# Patient Record
Sex: Female | Born: 1950 | Race: White | Hispanic: No | Marital: Single | Smoking: Never smoker
Health system: Southern US, Community
[De-identification: ages and names within clinical notes are randomized; demographics above are authoritative.]

## PROBLEM LIST (undated history)

## (undated) HISTORY — PX: BREAST BIOPSY: SHX20

---

## 2001-01-14 ENCOUNTER — Other Ambulatory Visit: Admission: RE | Admit: 2001-01-14 | Discharge: 2001-01-14 | Payer: Self-pay | Admitting: *Deleted

## 2001-02-26 ENCOUNTER — Other Ambulatory Visit: Admission: RE | Admit: 2001-02-26 | Discharge: 2001-02-26 | Payer: Self-pay | Admitting: *Deleted

## 2001-08-04 ENCOUNTER — Encounter (INDEPENDENT_AMBULATORY_CARE_PROVIDER_SITE_OTHER): Payer: Self-pay | Admitting: Specialist

## 2001-08-04 ENCOUNTER — Observation Stay (HOSPITAL_COMMUNITY): Admission: RE | Admit: 2001-08-04 | Discharge: 2001-08-05 | Payer: Self-pay | Admitting: *Deleted

## 2002-02-10 ENCOUNTER — Other Ambulatory Visit: Admission: RE | Admit: 2002-02-10 | Discharge: 2002-02-10 | Payer: Self-pay | Admitting: *Deleted

## 2003-05-01 ENCOUNTER — Encounter: Payer: Self-pay | Admitting: Emergency Medicine

## 2003-05-01 ENCOUNTER — Inpatient Hospital Stay (HOSPITAL_COMMUNITY): Admission: EM | Admit: 2003-05-01 | Discharge: 2003-05-02 | Payer: Self-pay | Admitting: Emergency Medicine

## 2003-05-01 ENCOUNTER — Encounter: Payer: Self-pay | Admitting: Specialist

## 2004-09-18 ENCOUNTER — Other Ambulatory Visit: Admission: RE | Admit: 2004-09-18 | Discharge: 2004-09-18 | Payer: Self-pay | Admitting: *Deleted

## 2005-11-10 ENCOUNTER — Other Ambulatory Visit: Admission: RE | Admit: 2005-11-10 | Discharge: 2005-11-10 | Payer: Self-pay | Admitting: *Deleted

## 2006-12-01 ENCOUNTER — Other Ambulatory Visit: Admission: RE | Admit: 2006-12-01 | Discharge: 2006-12-01 | Payer: Self-pay | Admitting: *Deleted

## 2008-01-10 ENCOUNTER — Other Ambulatory Visit: Admission: RE | Admit: 2008-01-10 | Discharge: 2008-01-10 | Payer: Self-pay | Admitting: *Deleted

## 2008-05-16 ENCOUNTER — Other Ambulatory Visit: Admission: RE | Admit: 2008-05-16 | Discharge: 2008-05-16 | Payer: Self-pay | Admitting: Gynecology

## 2008-09-19 ENCOUNTER — Other Ambulatory Visit: Admission: RE | Admit: 2008-09-19 | Discharge: 2008-09-19 | Payer: Self-pay | Admitting: Gynecology

## 2009-10-26 ENCOUNTER — Encounter: Admission: RE | Admit: 2009-10-26 | Discharge: 2009-10-26 | Payer: Self-pay | Admitting: Gynecology

## 2009-10-26 ENCOUNTER — Encounter (INDEPENDENT_AMBULATORY_CARE_PROVIDER_SITE_OTHER): Payer: Self-pay | Admitting: Gynecology

## 2010-05-14 ENCOUNTER — Encounter: Admission: RE | Admit: 2010-05-14 | Discharge: 2010-05-14 | Payer: Self-pay | Admitting: Gynecology

## 2010-11-25 ENCOUNTER — Encounter
Admission: RE | Admit: 2010-11-25 | Discharge: 2010-11-25 | Payer: Self-pay | Source: Home / Self Care | Attending: Family Medicine | Admitting: Family Medicine

## 2011-05-02 NOTE — Discharge Summary (Signed)
[facility]  Patient:    Rita Daniel, Rita Daniel Visit Number: [id number] MRN: [id number]          Service Type: SUR Location: 4W 0456 01 Attending Physician:  Collene Schlichter Dictated by:   Almedia Balls Randell Patient, M.D. Adm. Date:  08/04/2001 Disc. Date: 08/05/2001                             Discharge Summary  HISTORY OF PRESENT ILLNESS:  The patient is a 60 year old with abnormal uterine bleeding, enlarging uterus, pelvic pain, history of endometriosis for hysterectomy and bilateral salpingo-oophorectomy.  The remainder of her history and physical are as previously dictated.  LABORATORY DATA:  Preoperative hemoglobin 13.3.  Electrocardiogram was within normal limits.  HOSPITAL COURSE:  The patient was taken to the operating room on August 21, at which time abdominal supracervical hysterectomy and bilateral salpingo-oophorectomy were performed.  The patient did well postoperatively. Diet and ambulation were progressed over the evening of August 21 and early morning of August 22.  On the morning of August 22, she was afebrile and experiencing no problems.  It was felt that she could be discharged at that time.  FINAL DIAGNOSES: 1. Uterine enlargement. 2. Fibroids. 3. Endometriosis. 4. Abnormal uterine bleeding. 5. Pelvic pain.  OPERATION:  Abdominal supracervical hysterectomy and bilateral salpingo-oophorectomy.  Pathology report unavailable at the time of dictation.  DISPOSITION:  Discharged home to return to the office in two weeks for followup.  ACTIVITY:  She was instructed to gradually progress her activities over several weeks at home, and to limit lifting and driving for two weeks.  She was fully ambulatory, on a regular diet, and in good condition at the time of discharge.  DISCHARGE MEDICATIONS: 1. Mepergan Fortis. 2. Doxycycline 100 mg b.i.d. x 6 days.  The patient is to call for any problems. Dictated by:   Almedia Balls Randell Patient,  M.D. Attending Physician:  Collene Schlichter DD:  08/05/01 TD:  08/06/01 Job: [id number]

## 2011-05-02 NOTE — Op Note (Signed)
[name], [name]                            ACCOUNT NO.:  [id number]   MEDICAL RECORD NO.:  [id number]                   PATIENT TYPE:  INP   LOCATION:  1825                                 FACILITY:  [facility]   PHYSICIAN:  Jene Every, M.D.                 DATE OF BIRTH:  1951/11/03   DATE OF PROCEDURE:  05/01/2003  DATE OF DISCHARGE:                                 OPERATIVE REPORT   PREOPERATIVE DIAGNOSIS:  Bimalleolar fracture, ankle fracture dislocation.   POSTOPERATIVE DIAGNOSIS:  Bimalleolar fracture, ankle fracture dislocation.   PROCEDURE:  ORIF right ankle.  Retrieval of intra-articular loose bodies.   ANESTHESIA:  General.   SURGEON:  Jene Every, M.D.   ASSISTANT:  Roma Schanz, P.A.   INDICATIONS FOR PROCEDURE:  This 60 year old sustained a bimalleolar ankle  fracture. Operative intervention was indicated for open reduction and  internal fixation and stabilization.  She was provisionally reduced in the  emergency room and splinted.  Risks and benefits were discussed including  bleeding, infection, __________, wound dehiscence, etc.  There was a lesion  over the medial aspect of the medial malleolus, abrasion over the head of  the fracture.   DESCRIPTION OF PROCEDURE:  The patient was placed in the supine position.  After induction of general anesthesia, the right lower extremity was prepped  and draped and exsanguinated in the usual sterile fashion.  The thigh  tourniquet was inflated to 350 mmHg.  Incision was made over the fibula.  Subcutaneous tissue was dissected.  Bipolar cautery utilized to achieve  hemostasis.  Curvilinear incision was made over the medial malleolus.  Subcutaneous tissue was dissected.  Bipolar cautery was utilized to achieve  hemostasis.  Soft tissue was bulging here noted.  We were posterior to the  area of the abrasion and splinting.  Fracture of the medial malleolus was  noted. This was opened retrieving intra-articular loose  body.  Irrigated the  joint.  Following this, we moved to the fibular side, exposing the fibular  fracture, protecting the sural nerve, superficial peroneal nerve. Complete  fracture was noted. This was reduced, held with a tenaculum, anatomic  reduction was obtained.  We placed interfragmentary screw after the  appropriate drilling and over-drilling.  Compression screw applied. We then  placed a fibula plate, six-hole, fully threaded, and contoured.  Fixed it  proximally with three fully threaded cortical screws and distally with two  fully threaded cancellous screws of the appropriate depth, gauge, and  measurements.  Insertion of the screws with excellent purchase.  The  syndesmosis was found to be intact.   Next, we removed the periosteum from the medial malleolar fracture,  skeletonized the fracture, reduced it.  Fracture hematoma expressed and held  with a reduction clamped.  Placed two 4.0 partially threaded cancellous  screws after provisional fixation with K-wires.  44 mm were utilized and  excellent purchase  obtained. Tenaculum removed.  Stable fracture. We  radiographed it. There was stable mortis, stable syndesmosis.  The wound was  copiously irrigated.  Closed the periosteum with 2-0 Vicryl simple sutures.  Subcutaneous tissue reapproximated bilaterally with 2-0 Vicryl simple  sutures. The skin was reapproximated with staples.  Adaptic was placed. The  tourniquet was deflated.  There was adequate revascularization of the lower  extremity was appreciated.  A short leg cast, well-  padded was applied.  Tourniquet time was 1 hour and 20 minutes.  AP and  lateral plane films showed excellent reduction of the fracture.   The patient tolerated the procedure well with no complications.                                               Jene Every, M.D.    [name]  D:  05/01/2003  T:  05/02/2003  Job:  [id number]

## 2011-05-02 NOTE — H&P (Signed)
[facility]  Patient:    Rita Daniel, Rita Daniel Visit Number: [id number] MRN: [id number]          Service Type: Attending:  Almedia Balls. Randell Patient, M.D. Adm. Date:  08/04/01                           History and Physical  CHIEF COMPLAINT:  Fibroids, postmenopausal bleeding.  HISTORY OF PRESENT ILLNESS:  The patient is a 60 year old, gravida 0, para 0 whose last menstrual period was several years ago.  She had been followed in our office over several years.  At her routine exam on January 14, 2001, she had related that she had heavy flow and some pain on December 24, 2000.  Saline sonogram was performed showing an irregular endometrial stripe and a left ovarian cyst which was somewhat tender and appeared simple.  CA-125 was obtained which was normal.  Repeat ultrasound in mid March showed the cyst to persist and that the endometrium remained irregular.  She underwent laparoscopy and hysteroscopy D&C on February 26, 2001, with findings of pelvic adhesions, endometriosis, left adnexal/ovarian cyst, and myomata of uteri approximately [redacted] weeks gestational size.  Pathology report on the tissue of the D&C showed a benign endometrial polyp and disordered proliferative endometrium.  She has continued to have problems with bleeding since that time.  An examination in late July 2002 showed uterus approximately 12-[redacted] weeks gestational size and tender.  It was felt that in view of the increasing myomata and history of endometriosis and the continued postmenopausal bleeding, that abdominal hysterectomy with bilateral salpingo-oophorectomy was indicated.  This procedure has been discussed fully with the patient.  This discussion included the procedure in full, the risks involved to include risks of anesthesia, injury to bowel, bladder, blood vessel, ureters, postoperative hemorrhage, infection, and recuperation.  The patient has declined hormone replacement in the past and presumably  would continue to decline this.  Pap smear was normal in January 2002.  PAST MEDICAL HISTORY:  Essentially noncontributory in that the patient has no serious illnesses and has only undergone the above-noted surgery.  ALLERGIES:  No known drug allergies.  MEDICATIONS:  None regularly.  FAMILY HISTORY:  Noncontributory, except for two sisters and mother with myomata as well.  REVIEW OF SYSTEMS:  HEENT:  Wears glasses.  CARDIORESPIRATORY:  Negative. GASTROINTESTINAL:  Negative.  GENITOURINARY:  As in present illness. NEUROMUSCULAR:  Negative.  PHYSICAL EXAMINATION:  VITAL SIGNS:  Height 5 feet 2-1/4 inches, weight 165 pounds, blood pressure 152/88, pulse 72, respirations 18.  GENERAL:  Well-developed white female in no acute distress.  HEENT:  Within normal limits.  NECK:  Supple without masses, adenopathy, or bruits.  HEART:  Regular rate and rhythm without murmurs.  LUNGS:  Clear to auscultation and percussion.  BREASTS:  Sitting and lying without mass.  Axilla negative.  ABDOMEN:  Flat and soft with irregularity in the lower abdomen to approximately 3-4 fingerbreadths above synthesis pubis.  PELVIC:  External genitalia, Bartholins, urethral, and Skenes glands are atrophic.  Vagina is atrophic as well and admits only one digit.  It is not possible to visualize the cervix secondary to the atrophic vaginal mucosa and the patients guarding.  Uterus is felt to be midposition and approximately 12-[redacted] weeks gestational size on rectal exam.  There are no palpable adnexal mass.  Rectal exam is otherwise negative.  EXTREMITIES:  Within normal limits.  Central nervous system grossly intact. Skin without suspicious lesions.  IMPRESSION:  Leiomyomata uteri enlarging, continued postmenopausal bleeding, endometriosis, and pelvic pain.  DISPOSITION:  As noted above. Attending:  Almedia Balls. Randell Patient, M.D. DD:  08/02/01 TD:  08/03/01 Job: [id number]

## 2011-05-02 NOTE — Op Note (Signed)
[facility]  Patient:    Rita Daniel, Rita Daniel Visit Number: [id number] MRN: [id number]          Service Type: SUR Location: 4W 0456 01 Attending Physician:  Collene Schlichter Proc. Date: 08/04/01 Adm. Date:  08/04/2001   CC:         Harl Bowie, M.D.   Operative Report  PREOPERATIVE DIAGNOSES:  Uterine enlargement with pain, abnormal bleeding, fibroids.  POSTOPERATIVE DIAGNOSES:  Uterine enlargement with pain, abnormal bleeding, fibroids.  Pending pathology.  OPERATION PERFORMED:  Abdominal supracervical hysterectomy, bilateral salpingo-oophorectomy.  ANESTHESIA:  General with oral tracheal.  SURGEON:  Almedia Balls. Randell Patient, M.D.  FIRST ASSISTANT:  Harl Bowie, M.D.  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 She has been fully counselled as to the nature of the procedure and the risks involved to include risk of anesthesia, injury to bowel, bladder, blood vessels, ureters, postoperative hemorrhage, infection, recuperation, and possible hormone replacement. She fully understands all these considerations and wishes to proceed on August 04, 2001.  OPERATIVE FINDINGS:  On entry into the abdomen, the uterus was noted to be approximately [redacted] weeks gestational size with very prominent leiomyomata on the anterior fundal surface just under the urinary bladder. Both tubes and ovaries appeared normal except that there were some adhesions along the area of the left adnexal structures. Palpation of the upper abdominal viscera to include lower liver edge, gallbladder, spleen, area of the appendix, kidneys and periaortic areas were normal to palpation and/or visualization.  DESCRIPTION OF PROCEDURE:  With the patient under general anesthesia, prepared and draped in the usual sterile fashion, with a Foley catheter in her bladder, a lower abdominal transverse incision was made  and carried into the peritoneal cavity without difficulty. A self retaining retractor was placed, and the bowel was packed off. Kelly clamps were used to clamp the uterine ovarian anastomoses, tubes and round ligaments bilaterally for traction and hemostasis. The round ligaments were then transected using Bovie electrocoagulation with development of a bladder flap anteriorly and development of the retroperitoneal space with identification of the infundibulopelvic ligaments well above the ureters. The IP ligaments were then clamped, cut and doubly ligated with #1 chromic catgut. The uterine vessels bilaterally were then skeletonized, clamped, cut and suture ligated with #1 chromic catgut. The cardinal ligaments bilaterally were likewise clamped, cut and suture ligated with #1 chromic catgut. It was then possible to excise the uterine fundus, tubes and ovaries as a single specimen by using Bovie electrocoagulation to transect the cervix. The endocervical canal was treated with Bovie electrocoagulation for destruction of endocervical glands and prevention of leukorrhea. The cervix was reapproximated and rendered hemostatic with interrupted figure-of-eight sutures of #1 chromic catgut. The area was lavaged with copious amounts of lactated Ringers solution, and after noting that hemostasis was maintained and that sponge and instrument counts were correct, the peritoneum was closed with a continuous suture of #0 Vicryl. The fascia was closed with two sutures of #0 Vicryl which were brought from the lateral aspects of the incision and tied separately in the midline. The subcutaneous fat was reapproximated with interrupted horizontal mattress sutures of #1 chromic catgut. The skin was closed with a subcuticular suture of 3-0 plain catgut. Estimated blood loss was less than 100 ml. The patient was taken to the recovery room in good condition with clear urine and a Foley catheter tubing. She will be  placed  on 23 hour observation following surgery. ttending Physician:  Collene Schlichter DD:  08/04/01 TD:  08/04/01 Job: [phone]

## 2011-11-20 ENCOUNTER — Other Ambulatory Visit: Payer: Self-pay | Admitting: Family Medicine

## 2011-11-20 DIAGNOSIS — N63 Unspecified lump in unspecified breast: Secondary | ICD-10-CM

## 2011-12-03 ENCOUNTER — Ambulatory Visit
Admission: RE | Admit: 2011-12-03 | Discharge: 2011-12-03 | Disposition: A | Discharge status: Home / Self Care | Payer: BC Managed Care – PPO | Source: Ambulatory Visit | Attending: Family Medicine | Admitting: Family Medicine

## 2011-12-03 DIAGNOSIS — N63 Unspecified lump in unspecified breast: Secondary | ICD-10-CM

## 2012-11-15 ENCOUNTER — Other Ambulatory Visit: Payer: Self-pay | Admitting: Gynecology

## 2012-11-15 DIAGNOSIS — Z1231 Encounter for screening mammogram for malignant neoplasm of breast: Secondary | ICD-10-CM

## 2012-12-23 ENCOUNTER — Ambulatory Visit
Admission: RE | Admit: 2012-12-23 | Discharge: 2012-12-23 | Disposition: A | Discharge status: Home / Self Care | Payer: BC Managed Care – PPO | Source: Ambulatory Visit | Attending: Gynecology | Admitting: Gynecology

## 2012-12-23 DIAGNOSIS — Z1231 Encounter for screening mammogram for malignant neoplasm of breast: Secondary | ICD-10-CM

## 2013-12-13 ENCOUNTER — Other Ambulatory Visit: Payer: Self-pay

## 2013-12-13 DIAGNOSIS — Z1231 Encounter for screening mammogram for malignant neoplasm of breast: Secondary | ICD-10-CM

## 2013-12-26 ENCOUNTER — Ambulatory Visit
Admission: RE | Admit: 2013-12-26 | Discharge: 2013-12-26 | Disposition: A | Discharge status: Home / Self Care | Payer: BC Managed Care – PPO | Source: Ambulatory Visit

## 2013-12-26 DIAGNOSIS — Z1231 Encounter for screening mammogram for malignant neoplasm of breast: Secondary | ICD-10-CM

## 2014-10-23 ENCOUNTER — Other Ambulatory Visit: Payer: Self-pay

## 2014-10-23 DIAGNOSIS — Z1231 Encounter for screening mammogram for malignant neoplasm of breast: Secondary | ICD-10-CM

## 2014-12-26 ENCOUNTER — Other Ambulatory Visit: Payer: Self-pay | Admitting: Gynecology

## 2014-12-27 LAB — CYTOLOGY - PAP

## 2014-12-28 ENCOUNTER — Ambulatory Visit
Admission: RE | Admit: 2014-12-28 | Discharge: 2014-12-28 | Disposition: A | Discharge status: Home / Self Care | Payer: Medicare HMO | Source: Ambulatory Visit

## 2014-12-28 DIAGNOSIS — Z1231 Encounter for screening mammogram for malignant neoplasm of breast: Secondary | ICD-10-CM

## 2015-07-05 ENCOUNTER — Other Ambulatory Visit: Payer: Self-pay | Admitting: Gynecology

## 2015-07-09 LAB — CYTOLOGY - PAP

## 2015-12-06 ENCOUNTER — Other Ambulatory Visit: Payer: Self-pay

## 2015-12-06 DIAGNOSIS — Z1231 Encounter for screening mammogram for malignant neoplasm of breast: Secondary | ICD-10-CM

## 2016-01-11 ENCOUNTER — Ambulatory Visit
Admission: RE | Admit: 2016-01-11 | Discharge: 2016-01-11 | Disposition: A | Discharge status: Home / Self Care | Payer: 59 | Source: Ambulatory Visit

## 2016-01-11 DIAGNOSIS — Z1231 Encounter for screening mammogram for malignant neoplasm of breast: Secondary | ICD-10-CM

## 2016-12-01 ENCOUNTER — Other Ambulatory Visit: Payer: Self-pay | Admitting: Obstetrics and Gynecology

## 2016-12-01 DIAGNOSIS — Z1231 Encounter for screening mammogram for malignant neoplasm of breast: Secondary | ICD-10-CM

## 2017-01-12 ENCOUNTER — Ambulatory Visit
Admission: RE | Admit: 2017-01-12 | Discharge: 2017-01-12 | Disposition: A | Discharge status: Home / Self Care | Payer: 59 | Source: Ambulatory Visit | Attending: Obstetrics and Gynecology | Admitting: Obstetrics and Gynecology

## 2017-01-12 DIAGNOSIS — Z1231 Encounter for screening mammogram for malignant neoplasm of breast: Secondary | ICD-10-CM

## 2017-01-14 ENCOUNTER — Other Ambulatory Visit: Payer: Self-pay | Admitting: Obstetrics and Gynecology

## 2017-01-14 DIAGNOSIS — R928 Other abnormal and inconclusive findings on diagnostic imaging of breast: Secondary | ICD-10-CM

## 2017-01-16 ENCOUNTER — Ambulatory Visit
Admission: RE | Admit: 2017-01-16 | Discharge: 2017-01-16 | Disposition: A | Discharge status: Home / Self Care | Payer: 59 | Source: Ambulatory Visit | Attending: Obstetrics and Gynecology | Admitting: Obstetrics and Gynecology

## 2017-01-16 DIAGNOSIS — R928 Other abnormal and inconclusive findings on diagnostic imaging of breast: Secondary | ICD-10-CM

## 2017-12-11 ENCOUNTER — Other Ambulatory Visit: Payer: Self-pay | Admitting: Obstetrics and Gynecology

## 2017-12-11 DIAGNOSIS — Z139 Encounter for screening, unspecified: Secondary | ICD-10-CM

## 2018-01-12 ENCOUNTER — Ambulatory Visit
Admission: RE | Admit: 2018-01-12 | Discharge: 2018-01-12 | Disposition: A | Discharge status: Home / Self Care | Payer: 59 | Source: Ambulatory Visit | Attending: Obstetrics and Gynecology | Admitting: Obstetrics and Gynecology

## 2018-01-12 DIAGNOSIS — Z139 Encounter for screening, unspecified: Secondary | ICD-10-CM

## 2018-12-29 ENCOUNTER — Other Ambulatory Visit: Payer: Self-pay | Admitting: Obstetrics and Gynecology

## 2018-12-29 DIAGNOSIS — Z1231 Encounter for screening mammogram for malignant neoplasm of breast: Secondary | ICD-10-CM

## 2019-01-25 ENCOUNTER — Ambulatory Visit
Admission: RE | Admit: 2019-01-25 | Discharge: 2019-01-25 | Disposition: A | Discharge status: Home / Self Care | Payer: Medicare Other | Source: Ambulatory Visit | Attending: Obstetrics and Gynecology | Admitting: Obstetrics and Gynecology

## 2019-01-25 DIAGNOSIS — Z1231 Encounter for screening mammogram for malignant neoplasm of breast: Secondary | ICD-10-CM

## 2019-03-27 ENCOUNTER — Encounter (HOSPITAL_BASED_OUTPATIENT_CLINIC_OR_DEPARTMENT_OTHER): Payer: Self-pay | Admitting: Emergency Medicine

## 2019-03-27 ENCOUNTER — Emergency Department (HOSPITAL_BASED_OUTPATIENT_CLINIC_OR_DEPARTMENT_OTHER)
Admission: EM | Admit: 2019-03-27 | Discharge: 2019-03-27 | Disposition: A | Discharge status: Home / Self Care | Payer: Medicare Other | Attending: Emergency Medicine | Admitting: Emergency Medicine

## 2019-03-27 ENCOUNTER — Emergency Department (HOSPITAL_BASED_OUTPATIENT_CLINIC_OR_DEPARTMENT_OTHER): Payer: Medicare Other

## 2019-03-27 ENCOUNTER — Other Ambulatory Visit: Payer: Self-pay

## 2019-03-27 DIAGNOSIS — S39012A Strain of muscle, fascia and tendon of lower back, initial encounter: Secondary | ICD-10-CM | POA: Diagnosis not present

## 2019-03-27 DIAGNOSIS — X500XXA Overexertion from strenuous movement or load, initial encounter: Secondary | ICD-10-CM | POA: Diagnosis not present

## 2019-03-27 DIAGNOSIS — Y92003 Bedroom of unspecified non-institutional (private) residence as the place of occurrence of the external cause: Secondary | ICD-10-CM | POA: Diagnosis not present

## 2019-03-27 DIAGNOSIS — Y999 Unspecified external cause status: Secondary | ICD-10-CM | POA: Insufficient documentation

## 2019-03-27 DIAGNOSIS — S3992XA Unspecified injury of lower back, initial encounter: Secondary | ICD-10-CM | POA: Diagnosis present

## 2019-03-27 DIAGNOSIS — M62830 Muscle spasm of back: Secondary | ICD-10-CM | POA: Insufficient documentation

## 2019-03-27 DIAGNOSIS — T148XXA Other injury of unspecified body region, initial encounter: Secondary | ICD-10-CM

## 2019-03-27 DIAGNOSIS — Y93F2 Activity, caregiving, lifting: Secondary | ICD-10-CM | POA: Diagnosis not present

## 2019-03-27 MED ORDER — CYCLOBENZAPRINE HCL 5 MG PO TABS: 5.0000 mg | ORAL_TABLET | Freq: Two times a day (BID) | ORAL | 0 refills | Status: AC | PRN

## 2019-03-27 NOTE — Discharge Instructions (Signed)
Your history and exam today are consistent with injuring the muscles in her low back.  The x-ray did not show acute fracture dislocation.  It did show the degenerative disc disease in her low back.  Please fill the prescription of muscle relaxant if your symptoms continue to worsen and you need assistance with the pain.  Please rest.  Please stay hydrated.  Please follow-up with your primary doctor.  If any symptoms change or worsen or develop new symptoms like we discussed, please return to nearest emergency department.

## 2019-03-27 NOTE — ED Triage Notes (Signed)
Pt reports tried to help her mom get dressed, tried to catch her mom from sliding off the bed and heard a pop in her low back. Pt denies numbness or tingling down legs.

## 2019-03-27 NOTE — ED Notes (Signed)
Patient transported to X-ray 

## 2019-03-27 NOTE — ED Notes (Signed)
ED Provider at bedside. 

## 2019-03-27 NOTE — ED Provider Notes (Signed)
[facility] EMERGENCY DEPARTMENT Provider Note   CSN: [id number] Arrival date & time: 03/27/19  0931    History   Chief Complaint Chief Complaint  Patient presents with  . Back Pain    HPI Rita Daniel is a 69 y.o. female.     The history is provided by the patient and medical records. No language interpreter was used.  Back Pain  Location:  Lumbar spine Quality:  Aching Radiates to:  Does not radiate Pain severity:  Moderate Pain is:  Unable to specify Onset quality:  Sudden Duration:  2 hours Timing:  Constant Progression:  Improving Chronicity:  New Context: lifting heavy objects   Relieved by:  Being still Worsened by:  Twisting Associated symptoms: no abdominal pain, no bladder incontinence, no bowel incontinence, no chest pain, no dysuria, no fever, no headaches, no leg pain, no numbness, no paresthesias, no pelvic pain, no perianal numbness, no tingling and no weakness   Risk factors: no hx of cancer     History reviewed. No pertinent past medical history.  There are no active problems to display for this patient.   Past Surgical History:  Procedure Laterality Date  . BREAST BIOPSY Right      OB History   No obstetric history on file.      Home Medications    Prior to Admission medications   Not on File    Family History No family history on file.  Social History Social History   Tobacco Use  . Smoking status: Never Smoker  . Smokeless tobacco: Never Used  Substance Use Topics  . Alcohol use: Not Currently  . Drug use: Not Currently     Allergies   Patient has no known allergies.   Review of Systems Review of Systems  Constitutional: Negative for chills, diaphoresis, fatigue and fever.  HENT: Negative for congestion.   Eyes: Negative for visual disturbance.  Respiratory: Negative for cough, chest tightness, shortness of breath and wheezing.   Cardiovascular: Negative for chest pain, palpitations and leg swelling.   Gastrointestinal: Negative for abdominal pain, bowel incontinence, constipation, diarrhea, nausea and vomiting.  Genitourinary: Negative for bladder incontinence, dysuria, flank pain and pelvic pain.  Musculoskeletal: Positive for back pain. Negative for neck pain and neck stiffness.  Neurological: Negative for tingling, weakness, light-headedness, numbness, headaches and paresthesias.  Psychiatric/Behavioral: Negative for agitation and confusion.  All other systems reviewed and are negative.    Physical Exam Updated Vital Signs BP (!) 168/83   Pulse 85   Temp 98 F (36.7 C) (Oral)   Resp 20   Ht 5\' 2"  (1.575 m)   Wt 86.2 kg   SpO2 100%   BMI 34.75 kg/m   Physical Exam Vitals signs and nursing note reviewed.  Constitutional:      General: She is not in acute distress.    Appearance: She is well-developed. She is not ill-appearing, toxic-appearing or diaphoretic.  HENT:     Head: Normocephalic and atraumatic.     Right Ear: External ear normal.     Left Ear: External ear normal.     Nose: Nose normal. No congestion or rhinorrhea.     Mouth/Throat:     Pharynx: No oropharyngeal exudate.  Eyes:     Conjunctiva/sclera: Conjunctivae normal.     Pupils: Pupils are equal, round, and reactive to light.  Neck:     Musculoskeletal: Normal range of motion and neck supple. No muscular tenderness.  Cardiovascular:  Rate and Rhythm: Normal rate.  Pulmonary:     Effort: No respiratory distress.     Breath sounds: No stridor. No wheezing, rhonchi or rales.  Chest:     Chest wall: No tenderness.  Abdominal:     General: There is no distension.     Tenderness: There is no abdominal tenderness. There is no right CVA tenderness, left CVA tenderness or rebound.  Musculoskeletal:        General: No tenderness.     Right lower leg: No edema.     Left lower leg: No edema.  Skin:    General: Skin is warm.     Capillary Refill: Capillary refill takes less than 2 seconds.      Findings: No erythema or rash.  Neurological:     General: No focal deficit present.     Mental Status: She is alert and oriented to person, place, and time.     Sensory: No sensory deficit.     Motor: No weakness or abnormal muscle tone.     Deep Tendon Reflexes: Reflexes are normal and symmetric. Reflexes normal.  Psychiatric:        Mood and Affect: Mood normal.      ED Treatments / Results  Labs (all labs ordered are listed, but only abnormal results are displayed) Labs Reviewed - No data to display  EKG None  Radiology Dg Lumbar Spine Complete  Result Date: 03/27/2019 CLINICAL DATA:  68 year old injured the low back earlier today while trying to keep her mother from falling. Patient states that she felt a popping sensation in the low back. No radiculopathy. Initial encounter. EXAM: LUMBAR SPINE - COMPLETE 4+ VIEW COMPARISON:  None. FINDINGS: Five non-rib-bearing lumbar vertebrae with anatomic alignment. No fractures. Disc spaces well-preserved with perhaps minimal narrowing at L4-5. Spondylosis involving the UPPER endplate of L4. Moderate disc space narrowing and endplate hypertrophic changes at T12-L1. 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 Facet degenerative changes at L3-4, L4-5 and L5-S1. Sacroiliac joints intact without significant degenerative changes. IMPRESSION: 1. No acute osseous abnormality. 2. Minimal degenerative disc disease at L4-5. 3. Moderate degenerative disc disease and spondylosis at T12-L1. 4. Facet degenerative changes at L3-4, L4-5 and L5-S1. Electronically Signed   By: Hulan Saashomas  Lawrence M.D.   On: 03/27/2019 10:44    Procedures Procedures (including critical care time)  Medications Ordered in ED Medications - No data to display   Initial Impression / Assessment and Plan / ED Course  I have reviewed the triage vital signs and the nursing notes.  Pertinent labs & imaging results that were available during my care of  the patient were reviewed by me and considered in my medical decision making (see chart for details).        Rita Daniel is a 68 y.o. female with no significant past medical history who presents with low back pain.  Patient reports that she was helping her mother out of the bed this morning when her mother slipped and the patient try to catch her.  Patient felt a pop in her low back and had immediate onset of moderate to severe low back pain.  She denies radiation of the pain.  She denies any loss of bowel or bladder control.  She denies any numbness, tingling, weakness of extremities.  She reports the pain was treated with Tylenol at home however due to continued pain she presents for evaluation.  She denies any nausea, vomiting, or urinary symptoms.  She denies any preceding symptoms.  She denies any pain in her upper back neck or head.  No other complaints.  On exam, patient had no back tenderness.  Patient has continued pain in her low back.  Straight leg raise was negative on both legs.  Normal sensation strength, and pulses in the legs.  Abdomen and chest nontender.  Upper back nontender.  Suspect soft tissue and muscular pain based on her description of symptoms and exam.    Given lack of concerning associated symptoms, shared decision made conversation was held and we decided to order x-ray instead of CT scan initially.  If x-ray shows abnormalities, will likely go to CT however if x-ray is reassuring, anticipate she will be stable for discharge home with outpatient follow-up.        11:09 AM X-ray shows no fracture or dislocation.  Patient does have some degenerative disc disease which patient was informed of.  Patient reports her pain has improved and is very mild currently.  Patient was felt stable for discharge home given improved symptoms and reassuring imaging.  We discussed possibility of occult injury and patient will follow with PCP for further management.  Patient given  prescription for low-dose muscle relaxant to use if her symptoms persist.  Patient understood plan of care and return precautions.  She otherwise or concerns and was discharged in good condition.   Final Clinical Impressions(s) / ED Diagnoses   Final diagnoses:  Muscle strain  Strain of lumbar region, initial encounter  Muscle spasm of back    ED Discharge Orders         Ordered    cyclobenzaprine (FLEXERIL) 5 MG tablet  2 times daily PRN     03/27/19 1108          Clinical Impression: 1. Muscle strain   2. Strain of lumbar region, initial encounter   3. Muscle spasm of back     Disposition: Discharge  Condition: Good  I have discussed the results, Dx and Tx plan with the pt(& family if present). He/she/they expressed understanding and agree(s) with the plan. Discharge instructions discussed at great length. Strict return precautions discussed and pt &/or family have verbalized understanding of the instructions. No further questions at time of discharge.    New Prescriptions   CYCLOBENZAPRINE (FLEXERIL) 5 MG TABLET    Take 1 tablet (5 mg total) by mouth 2 (two) times daily as needed for muscle spasms.    Follow Up: Tracey Harries, MD [address] [place] [address] [phone]     [facility] [place] EMERGENCY DEPARTMENT [address] [id number] [address] [place] [address] [phone]       Tegeler, Canary Brim, MD 03/27/19 1110

## 2020-01-18 ENCOUNTER — Other Ambulatory Visit: Payer: Self-pay | Admitting: Obstetrics and Gynecology

## 2020-01-18 DIAGNOSIS — Z1231 Encounter for screening mammogram for malignant neoplasm of breast: Secondary | ICD-10-CM

## 2020-02-24 ENCOUNTER — Ambulatory Visit
Admission: RE | Admit: 2020-02-24 | Discharge: 2020-02-24 | Disposition: A | Discharge status: Home / Self Care | Payer: Medicare Other | Source: Ambulatory Visit | Attending: Obstetrics and Gynecology | Admitting: Obstetrics and Gynecology

## 2020-02-24 ENCOUNTER — Other Ambulatory Visit: Payer: Self-pay

## 2020-02-24 DIAGNOSIS — Z1231 Encounter for screening mammogram for malignant neoplasm of breast: Secondary | ICD-10-CM

## 2020-02-27 ENCOUNTER — Other Ambulatory Visit: Payer: Self-pay | Admitting: Obstetrics and Gynecology

## 2020-02-27 DIAGNOSIS — R928 Other abnormal and inconclusive findings on diagnostic imaging of breast: Secondary | ICD-10-CM

## 2020-03-14 ENCOUNTER — Other Ambulatory Visit: Payer: Self-pay

## 2020-03-14 ENCOUNTER — Ambulatory Visit
Admission: RE | Admit: 2020-03-14 | Discharge: 2020-03-14 | Disposition: A | Discharge status: Home / Self Care | Payer: Medicare PPO | Source: Ambulatory Visit | Attending: Obstetrics and Gynecology | Admitting: Obstetrics and Gynecology

## 2020-03-14 DIAGNOSIS — R928 Other abnormal and inconclusive findings on diagnostic imaging of breast: Secondary | ICD-10-CM

## 2021-02-08 ENCOUNTER — Other Ambulatory Visit: Payer: Self-pay | Admitting: Obstetrics and Gynecology

## 2021-02-08 DIAGNOSIS — Z1231 Encounter for screening mammogram for malignant neoplasm of breast: Secondary | ICD-10-CM

## 2021-04-01 ENCOUNTER — Inpatient Hospital Stay: Admission: RE | Admit: 2021-04-01 | Payer: Medicare PPO | Source: Ambulatory Visit

## 2021-04-02 ENCOUNTER — Ambulatory Visit: Payer: Medicare PPO

## 2021-04-16 ENCOUNTER — Ambulatory Visit
Admission: RE | Admit: 2021-04-16 | Discharge: 2021-04-16 | Disposition: A | Discharge status: Home / Self Care | Payer: Medicare PPO | Source: Ambulatory Visit | Attending: Obstetrics and Gynecology | Admitting: Obstetrics and Gynecology

## 2021-04-16 ENCOUNTER — Other Ambulatory Visit: Payer: Self-pay

## 2021-04-16 DIAGNOSIS — Z1231 Encounter for screening mammogram for malignant neoplasm of breast: Secondary | ICD-10-CM

## 2021-04-18 ENCOUNTER — Other Ambulatory Visit: Payer: Self-pay | Admitting: Obstetrics and Gynecology

## 2021-04-18 DIAGNOSIS — R928 Other abnormal and inconclusive findings on diagnostic imaging of breast: Secondary | ICD-10-CM

## 2021-05-08 ENCOUNTER — Ambulatory Visit
Admission: RE | Admit: 2021-05-08 | Discharge: 2021-05-08 | Disposition: A | Discharge status: Home / Self Care | Payer: Medicare PPO | Source: Ambulatory Visit | Attending: Obstetrics and Gynecology | Admitting: Obstetrics and Gynecology

## 2021-05-08 ENCOUNTER — Other Ambulatory Visit: Payer: Self-pay

## 2021-05-08 ENCOUNTER — Ambulatory Visit: Admission: RE | Admit: 2021-05-08 | Payer: Medicare PPO | Source: Ambulatory Visit

## 2021-05-08 DIAGNOSIS — R928 Other abnormal and inconclusive findings on diagnostic imaging of breast: Secondary | ICD-10-CM

## 2021-12-07 ENCOUNTER — Encounter (HOSPITAL_BASED_OUTPATIENT_CLINIC_OR_DEPARTMENT_OTHER): Payer: Self-pay

## 2021-12-07 ENCOUNTER — Emergency Department (HOSPITAL_BASED_OUTPATIENT_CLINIC_OR_DEPARTMENT_OTHER)
Admission: EM | Admit: 2021-12-07 | Discharge: 2021-12-07 | Disposition: A | Discharge status: Home / Self Care | Payer: Medicare PPO | Attending: Student | Admitting: Student

## 2021-12-07 ENCOUNTER — Other Ambulatory Visit: Payer: Self-pay

## 2021-12-07 DIAGNOSIS — Z20822 Contact with and (suspected) exposure to covid-19: Secondary | ICD-10-CM | POA: Insufficient documentation

## 2021-12-07 DIAGNOSIS — R0981 Nasal congestion: Secondary | ICD-10-CM | POA: Insufficient documentation

## 2021-12-07 DIAGNOSIS — J029 Acute pharyngitis, unspecified: Secondary | ICD-10-CM | POA: Diagnosis present

## 2021-12-07 DIAGNOSIS — I10 Essential (primary) hypertension: Secondary | ICD-10-CM | POA: Diagnosis not present

## 2021-12-07 LAB — RESP PANEL BY RT-PCR (FLU A&B, COVID) ARPGX2
Influenza A by PCR: NEGATIVE
Influenza B by PCR: NEGATIVE
SARS Coronavirus 2 by RT PCR: NEGATIVE

## 2021-12-07 LAB — GROUP A STREP BY PCR: Group A Strep by PCR: NOT DETECTED

## 2021-12-07 MED ORDER — DEXAMETHASONE SODIUM PHOSPHATE 10 MG/ML IJ SOLN: 10.0000 mg | Freq: Once | INTRAMUSCULAR | Status: DC

## 2021-12-07 MED ORDER — DEXAMETHASONE SODIUM PHOSPHATE 10 MG/ML IJ SOLN
10.0000 mg | Freq: Once | INTRAMUSCULAR | Status: AC
  Administered 2021-12-07: 22:00:00 10 mg via INTRAMUSCULAR
  Filled 2021-12-07: qty 1

## 2021-12-07 NOTE — ED Triage Notes (Addendum)
Pt states she has had a sore throat, progressively getting worse since 12/13. Pt states she has taken mucinex cold and flu and tylenol alternating without much relief  Last tylenol at 1800.

## 2021-12-07 NOTE — Discharge Instructions (Addendum)
You were seen in the ER for a sore throat.  Your strep test was negative, and your COVID/flu testing is still pending. We gave you one dose of steroids which I hope will help over the next day or so bring down the inflammation.  Continue taking ibuprofen or tylenol as needed at home. As we talked about, make sure to let your primary doctor know about your high blood pressure today.   Continue to monitor how you're doing and return to the ER for new or worsening symptoms such as headaches, dizziness, changes in your vision, chest pain, difficulty swallowing or difficulty breathing.   It has been a pleasure seeing and caring for you today and I hope you start feeling better soon!

## 2021-12-07 NOTE — ED Notes (Signed)
Notified ED provider about pt's high blood pressure.

## 2021-12-07 NOTE — ED Provider Notes (Signed)
[facility] EMERGENCY DEPT Provider Note   CSN: [id number] Arrival date & time: 12/07/21  1959     History Chief Complaint  Patient presents with   Sore Throat    Rita Daniel is a 70 y.o. female with history of a tonsillectomy as a child who presents to the emergency department complaining of a sore throat since 12/13.  Patient states that she has been taking Mucinex and Tylenol without relief.  She is overall tolerating p.o., but did have difficulty swallowing Tylenol earlier today.  She initially had nasal congestion and cough, but says that these have improved.  No fevers, chills, chest pain, difficulty breathing, nausea or vomiting.   Sore Throat Pertinent negatives include no chest pain, no abdominal pain, no headaches and no shortness of breath.      History reviewed. No pertinent past medical history.  There are no problems to display for this patient.   Past Surgical History:  Procedure Laterality Date   BREAST BIOPSY Right      OB History   No obstetric history on file.     History reviewed. No pertinent family history.  Social History   Tobacco Use   Smoking status: Never   Smokeless tobacco: Never  Vaping Use   Vaping Use: Never used  Substance Use Topics   Alcohol use: Not Currently   Drug use: Not Currently    Home Medications Prior to Admission medications   Medication Sig Start Date End Date Taking? Authorizing Provider  cyclobenzaprine (FLEXERIL) 5 MG tablet Take 1 tablet (5 mg total) by mouth 2 (two) times daily as needed for muscle spasms. 03/27/19   Tegeler, Canary Brim, MD    Allergies    Patient has no known allergies.  Review of Systems   Review of Systems  Constitutional:  Negative for chills and fever.  HENT:  Positive for sore throat and trouble swallowing. Negative for congestion.   Respiratory:  Negative for cough and shortness of breath.   Cardiovascular:  Negative for chest pain.  Gastrointestinal:   Negative for abdominal pain, constipation, diarrhea, nausea and vomiting.  Musculoskeletal:  Negative for myalgias.  Neurological:  Negative for headaches.  All other systems reviewed and are negative.  Physical Exam Updated Vital Signs BP (!) 204/72 (BP Location: Right Arm)    Pulse 82    Temp 97.8 F (36.6 C)    Resp 16    Ht 5\' 2"  (1.575 m)    Wt 78 kg    SpO2 97%    BMI 31.46 kg/m   Physical Exam Vitals and nursing note reviewed.  Constitutional:      Appearance: Normal appearance.  HENT:     Head: Normocephalic and atraumatic.     Mouth/Throat:     Mouth: Mucous membranes are moist.     Pharynx: Pharyngeal swelling and posterior oropharyngeal erythema present. No oropharyngeal exudate.  Eyes:     Conjunctiva/sclera: Conjunctivae normal.  Cardiovascular:     Rate and Rhythm: Normal rate and regular rhythm.  Pulmonary:     Effort: Pulmonary effort is normal. No respiratory distress.     Breath sounds: Normal breath sounds.  Abdominal:     General: There is no distension.     Palpations: Abdomen is soft.     Tenderness: There is no abdominal tenderness.  Skin:    General: Skin is warm and dry.  Neurological:     General: No focal deficit present.     Mental Status: She  is alert.    ED Results / Procedures / Treatments   Labs (all labs ordered are listed, but only abnormal results are displayed) Labs Reviewed  GROUP A STREP BY PCR  RESP PANEL BY RT-PCR (FLU A&B, COVID) [id number]    EKG None  Radiology No results found.  Procedures Procedures   Medications Ordered in ED Medications  dexamethasone (DECADRON) injection 10 mg (10 mg Intramuscular Given 12/07/21 2130)    ED Course  I have reviewed the triage vital signs and the nursing notes.  Pertinent labs & imaging results that were available during my care of the patient were reviewed by me and considered in my medical decision making (see chart for details).    MDM Rules/Calculators/A&P                           Patient is an otherwise healthy 70 year old female presents the emergency department complaining of a sore throat for 2 weeks.  Patient has been trying over-the-counter medications without relief.  On my exam patient is afebrile, not tachycardic, not hypoxic, and in no acute distress.  She has some oropharyngeal erythema and mild edema.  Lungs are clear to auscultation in all fields.  She has no change with eating to make me think this is related to acid reflux.  No recent changes in her diet to increase my suspicion of allergic reaction. Patient given 1 dose of Decadron.   Of note patient was found to be hypertensive to 231/118 in the emergency department.  Patient states that she has been in discussion with her primary doctor about managing her blood pressure, and had not placed her on any medication at this time.  She denies any symptoms like headaches, vision changes, chest pain, numbness, tingling, or dizziness.  I have low concern for hypertensive emergency today. Discussed importance of PCP follow up for long term management.  Overall patient is clinically well-appearing, and hemodynamically stable.  She is not requiring admission or inpatient treatment.  Discussed reasons to return to the emergency department for both sore throat and hypertension, patient agreeable to plan.  Final Clinical Impression(s) / ED Diagnoses Final diagnoses:  Viral pharyngitis  Asymptomatic hypertension    Rx / DC Orders ED Discharge Orders     None      Portions of this report may have been transcribed using voice recognition software. Every effort was made to ensure accuracy; however, inadvertent computerized transcription errors may be present.    Jeanella Flattery 12/07/21 2148    Glendora Score, MD 12/08/21 [phone]

## 2021-12-08 DIAGNOSIS — I10 Essential (primary) hypertension: Secondary | ICD-10-CM | POA: Diagnosis present

## 2021-12-09 ENCOUNTER — Other Ambulatory Visit: Payer: Self-pay

## 2021-12-09 ENCOUNTER — Emergency Department (HOSPITAL_COMMUNITY)
Admission: EM | Admit: 2021-12-09 | Discharge: 2021-12-09 | Disposition: A | Discharge status: Home / Self Care | Payer: Medicare PPO | Attending: Emergency Medicine | Admitting: Emergency Medicine

## 2021-12-09 DIAGNOSIS — I10 Essential (primary) hypertension: Secondary | ICD-10-CM

## 2021-12-09 LAB — COMPREHENSIVE METABOLIC PANEL
ALT: 12 U/L (ref 0–44)
AST: 24 U/L (ref 15–41)
Albumin: 3.9 g/dL (ref 3.5–5.0)
Alkaline Phosphatase: 65 U/L (ref 38–126)
Anion gap: 10 (ref 5–15)
BUN: 17 mg/dL (ref 8–23)
CO2: 23 mmol/L (ref 22–32)
Calcium: 9.5 mg/dL (ref 8.9–10.3)
Chloride: 107 mmol/L (ref 98–111)
Creatinine, Ser: 1.06 mg/dL — ABNORMAL HIGH (ref 0.44–1.00)
GFR, Estimated: 56 mL/min — ABNORMAL LOW (ref >60)
Glucose, Bld: 125 mg/dL — ABNORMAL HIGH (ref 70–99)
Potassium: 3.5 mmol/L (ref 3.5–5.1)
Sodium: 140 mmol/L (ref 135–145)
Total Bilirubin: 0.3 mg/dL (ref 0.3–1.2)
Total Protein: 7.1 g/dL (ref 6.5–8.1)

## 2021-12-09 MED ORDER — SODIUM CHLORIDE 0.9 % IV BOLUS
1000.0000 mL | Freq: Once | INTRAVENOUS | Status: AC
  Administered 2021-12-09: 15:00:00 1000 mL via INTRAVENOUS

## 2021-12-09 NOTE — ED Notes (Signed)
Pt NAD, a/ox4. Pt verbalizes understanding of all DC and f/u instructions. All questions answered. Pt walks with steady gait to lobby at DC.  ? ?

## 2021-12-09 NOTE — ED Provider Notes (Signed)
[facility] EMERGENCY DEPARTMENT Provider Note   CSN: [id number] Arrival date & time: 12/08/21  2335     History Chief Complaint  Patient presents with   Hypertension    KAYLEY ZEIDERS is a 70 y.o. female with medical history of none.  Patient presents to ED today complaining of facial flushing and "feeling like my heart was beating fast".  Patient states that this occurred last night around 10 PM before she went to bed after taking Tylenol.  Of note, patient recently felt she was ill with upper respiratory infection that began on the 13th.  Patient took Mucinex and over-the-counter medications to alleviate her symptoms.  Patient then presented to [facility] ED on 12/24 to be evaluated for upper respiratory infection.  Patient tested negative for COVID and flu at this time as well as strep.  On this visit her blood pressure was noted to be high.  Patient was given return precautions to include chest pain, shortness of breath, headaches and blurry vision.  Patient denies experiencing these today, but stated that she just wanted to be safe due to her facial flushing and tachycardia. Patient states she has been having discussions with PCP about high blood pressure but is not on any medications currently. Currently patient denies chest pain, shortness of breath, headaches or blurred vision.  See review of systems for further.   Hypertension Pertinent negatives include no chest pain, no abdominal pain, no headaches and no shortness of breath.      No past medical history on file.  There are no problems to display for this patient.   Past Surgical History:  Procedure Laterality Date   BREAST BIOPSY Right      OB History   No obstetric history on file.     No family history on file.  Social History   Tobacco Use   Smoking status: Never   Smokeless tobacco: Never  Vaping Use   Vaping Use: Never used  Substance Use Topics   Alcohol use: Not Currently   Drug use: Not  Currently    Home Medications Prior to Admission medications   Medication Sig Start Date End Date Taking? Authorizing Provider  acetaminophen (TYLENOL) 500 MG tablet Take 100 mg by mouth every 6 (six) hours as needed for moderate pain or headache.   Yes [provider]  Acetaminophen-guaiFENesin [facility] COLD & FLU PO) Take 15 mLs by mouth 2 (two) times daily as needed (congestion).   Yes [provider]  Carboxymethylcellul-Glycerin (REFRESH RELIEVA PF OP) Place 1 drop into both eyes 2 (two) times daily as needed (dry.itchy eyes).   Yes [provider]  cholecalciferol (VITAMIN D) 25 MCG (1000 UNIT) tablet Take 2,000 Units by mouth every evening.   Yes [provider]  pravastatin (PRAVACHOL) 40 MG tablet Take 40 mg by mouth every evening. 11/10/21  Yes [provider]  cyclobenzaprine (FLEXERIL) 5 MG tablet Take 1 tablet (5 mg total) by mouth 2 (two) times daily as needed for muscle spasms. Patient not taking: Reported on 12/09/2021 03/27/19   Tegeler, Canary Brim, MD    Allergies    Patient has no known allergies.  Review of Systems   Review of Systems  Constitutional:  Negative for chills and fever.  HENT:  Negative for sore throat.   Eyes:  Negative for visual disturbance.  Respiratory:  Negative for shortness of breath.   Cardiovascular:  Negative for chest pain.  Gastrointestinal:  Negative for abdominal pain, diarrhea,  nausea and vomiting.  Genitourinary:  Negative for dysuria.  Neurological:  Negative for dizziness, weakness, light-headedness and headaches.  All other systems reviewed and are negative.  Physical Exam Updated Vital Signs BP (!) 176/72    Pulse 68    Temp 99.1 F (37.3 C) (Oral)    Resp 16    Ht 5\' 2"  (1.575 m)    Wt 78 kg    SpO2 98%    BMI 31.45 kg/m   Physical Exam  ED Results / Procedures / Treatments   Labs (all labs ordered are listed, but only abnormal results are displayed) Labs Reviewed   COMPREHENSIVE METABOLIC PANEL - Abnormal; Notable for the following components:      Result Value   Glucose, Bld 125 (*)    Creatinine, Ser 1.06 (*)    GFR, Estimated 56 (*)    All other components within normal limits    EKG EKG Interpretation  Date/Time:  Monday December 09 2021 00:57:00 EST Ventricular Rate:  80 PR Interval:  132 QRS Duration: 74 QT Interval:  342 QTC Calculation: 394 R Axis:   -4 Text Interpretation: Normal sinus rhythm Anterior infarct , age undetermined Abnormal ECG No significant change since last tracing Confirmed by Deno Etienne [phone]) on 12/09/2021 12:40:27 PM  Radiology No results found.  Procedures Procedures   Medications Ordered in ED Medications  sodium chloride 0.9 % bolus 1,000 mL (1,000 mLs Intravenous New Bag/Given 12/09/21 1440)    ED Course  I have reviewed the triage vital signs and the nursing notes.  Pertinent labs & imaging results that were available during my care of the patient were reviewed by me and considered in my medical decision making (see chart for details).    MDM Rules/Calculators/A&P                          70 year old female presents to ED due to flushing and tachycardia night prior.  Patient recently seen at ED [facility] on 12/24 for upper respiratory infection type symptoms and noted to be hypertensive.  This is a known problem for the patient that she has been discussing with her PCP for quite some time she states.  On my evaluation patient denied any symptoms associated with endorgan failure.  Patient denied blurry vision, headaches, chest pain, shortness of breath, nausea, vomiting, fevers, abdominal pain, back pain.  I discussed with this patient her need to follow-up with her PCP soon to evaluate her hypertension since it seems to be getting worse.  The patient voiced agreement.  Also on the work-up I noticed that this patient had what seemed to be an AKI.  Creatinine jumped to 1.06 and her GFR decreased  to 56.  Patient states that she has had decreased oral intake over the last couple days due to not feeling well.  I treated this patient with a liter of saline with good effect.  I gave this patient return precautions which she voiced understanding with and I encouraged her to follow-up with her PCP very soon for evaluation of her blood pressure and to explore the option of medications.  The patient voiced understanding with my directions and voiced understanding of my return precautions.  This case was discussed with Dr. Tyrone Nine who is in agreement with plan to discharge with outpatient follow-up.  Patient is stable on discharge and had all her questions answered prior to being discharged.  Final Clinical Impression(s) / ED Diagnoses Final diagnoses:  Hypertension, unspecified type    Rx / DC Orders ED Discharge Orders     None        Azucena Cecil, PA-C 12/09/21 [place], DO 12/09/21 1603

## 2021-12-09 NOTE — Discharge Instructions (Signed)
Return to ED with any new or worsening symptoms such as headaches, blurred vision, shortness of breath, chest pain It is important you to follow-up with your PCP in the next week for evaluation of her high blood pressure Please make all efforts to stay hydrated.  If you notice any difficulty urinating or back pain please come back to the ED for further evaluation I have attached a blood pressure record sheet to this document that I would like for you to fill out with your blood pressures.  Bring this sheet to your PCP.  You can purchase a home blood pressure machine at [facility] or [facility] or [address]

## 2021-12-09 NOTE — ED Triage Notes (Signed)
Pt c/o hypertension. Pt reports it felt like her heart was beating fast and her face felt flushed.

## 2022-04-24 ENCOUNTER — Other Ambulatory Visit: Payer: Self-pay | Admitting: Obstetrics and Gynecology

## 2022-04-24 DIAGNOSIS — Z1231 Encounter for screening mammogram for malignant neoplasm of breast: Secondary | ICD-10-CM

## 2022-05-09 ENCOUNTER — Ambulatory Visit: Payer: Medicare PPO

## 2022-05-15 ENCOUNTER — Ambulatory Visit
Admission: RE | Admit: 2022-05-15 | Discharge: 2022-05-15 | Disposition: A | Discharge status: Home / Self Care | Payer: Medicare PPO | Source: Ambulatory Visit | Attending: Obstetrics and Gynecology | Admitting: Obstetrics and Gynecology

## 2022-05-15 DIAGNOSIS — Z1231 Encounter for screening mammogram for malignant neoplasm of breast: Secondary | ICD-10-CM

## 2023-02-13 IMAGING — MG MM DIGITAL SCREENING BILAT W/ TOMO AND CAD
8 series · 8 of 24 positions shown · non-contrast
Comparison: Previous exam(s).

CLINICAL DATA: Screening.

EXAM:
DIGITAL SCREENING BILATERAL MAMMOGRAM WITH TOMOSYNTHESIS AND CAD
TECHNIQUE: Bilateral screening digital craniocaudal and mediolateral oblique
mammograms were obtained. Bilateral screening digital breast
tomosynthesis was performed. The images were evaluated with
computer-aided detection.

[L CC synth-2D]
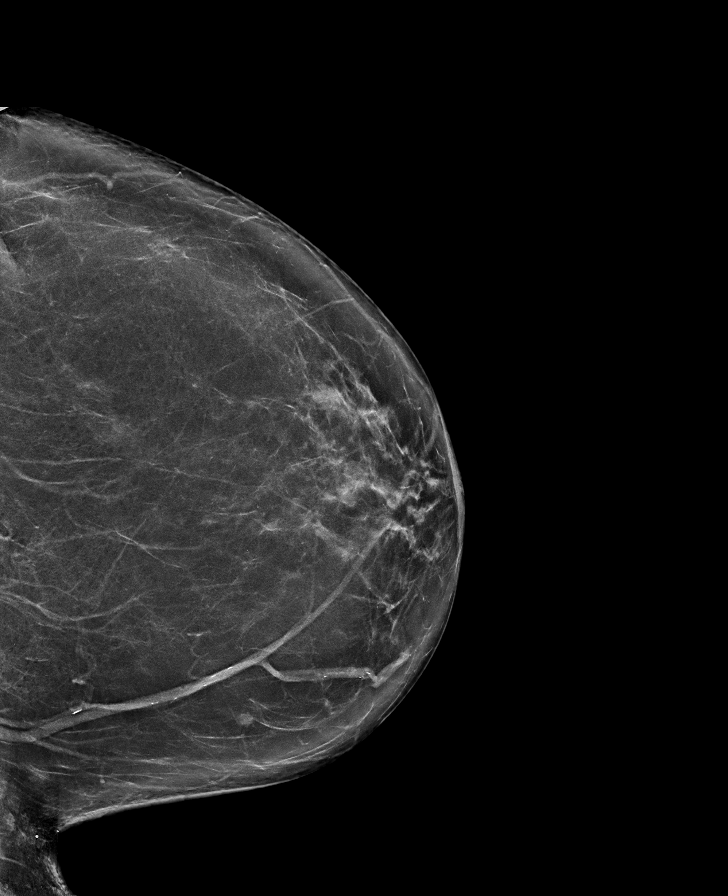

[R CC synth-2D]
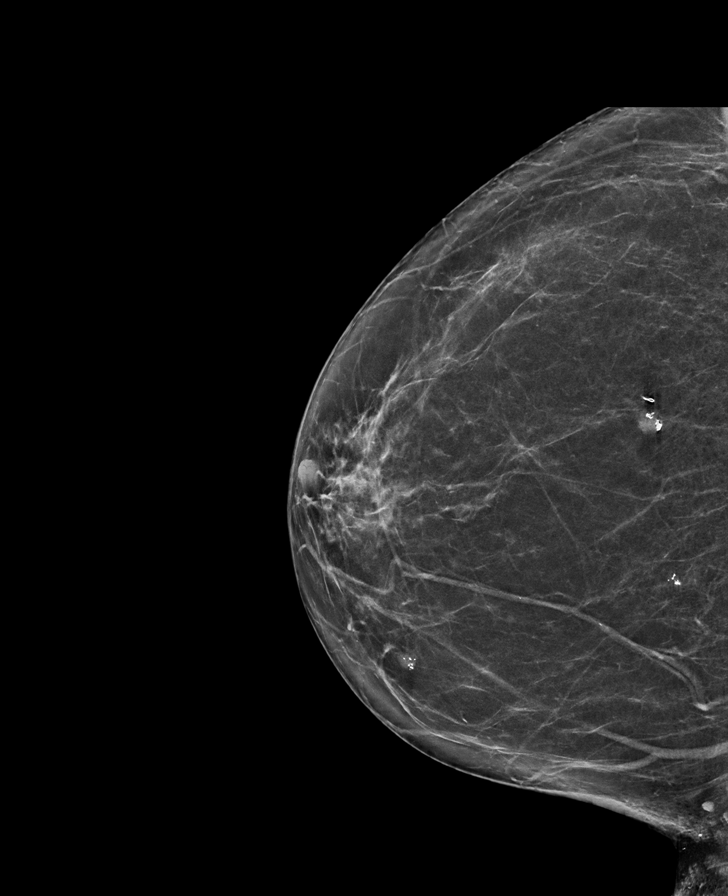

[R MLO synth-2D]
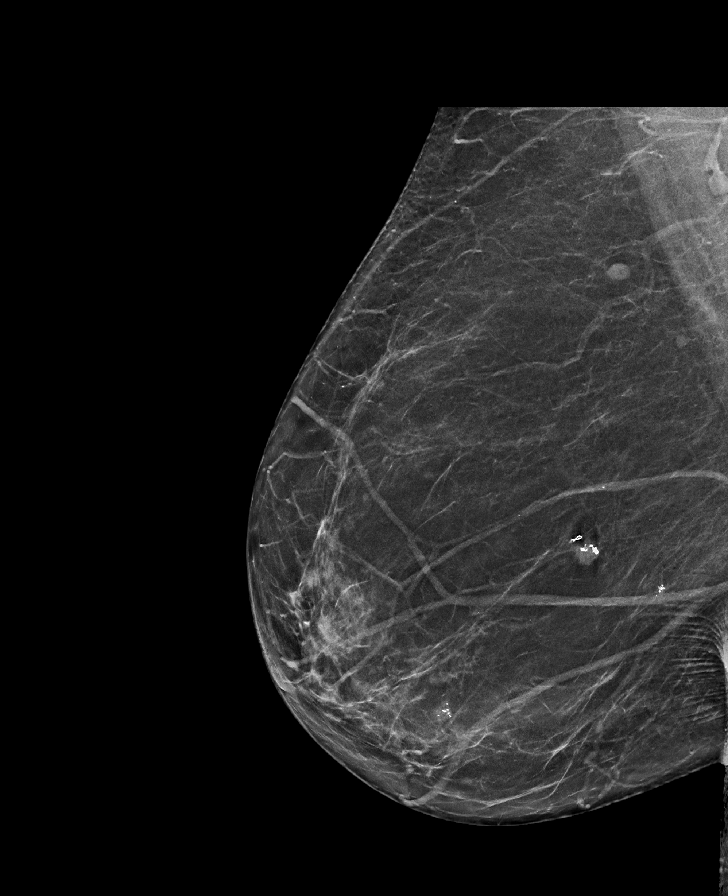

[L MLO synth-2D]
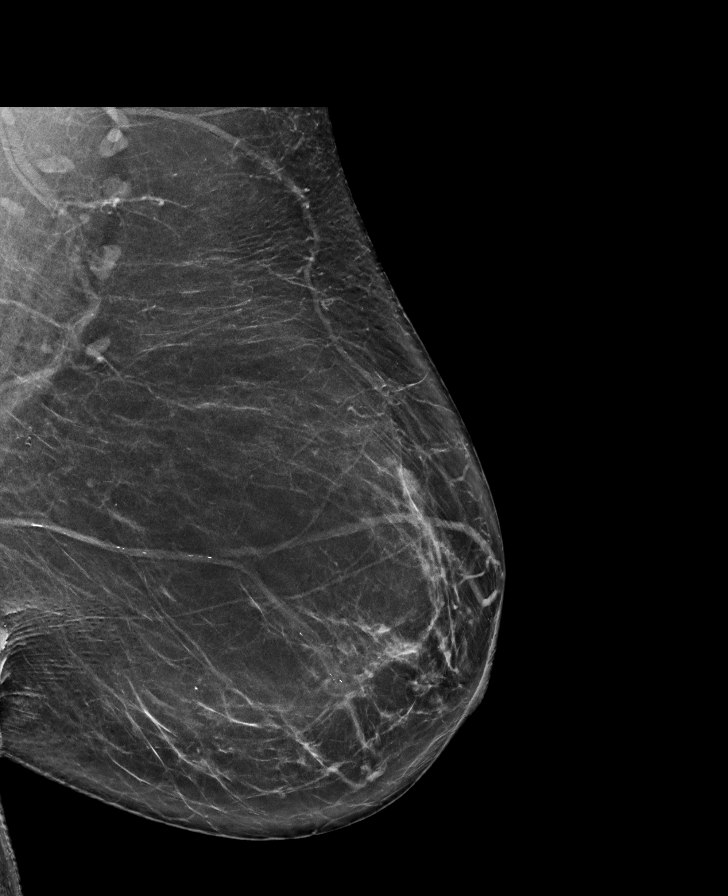

[R MLO tomo · tomo slice 38/75.0]
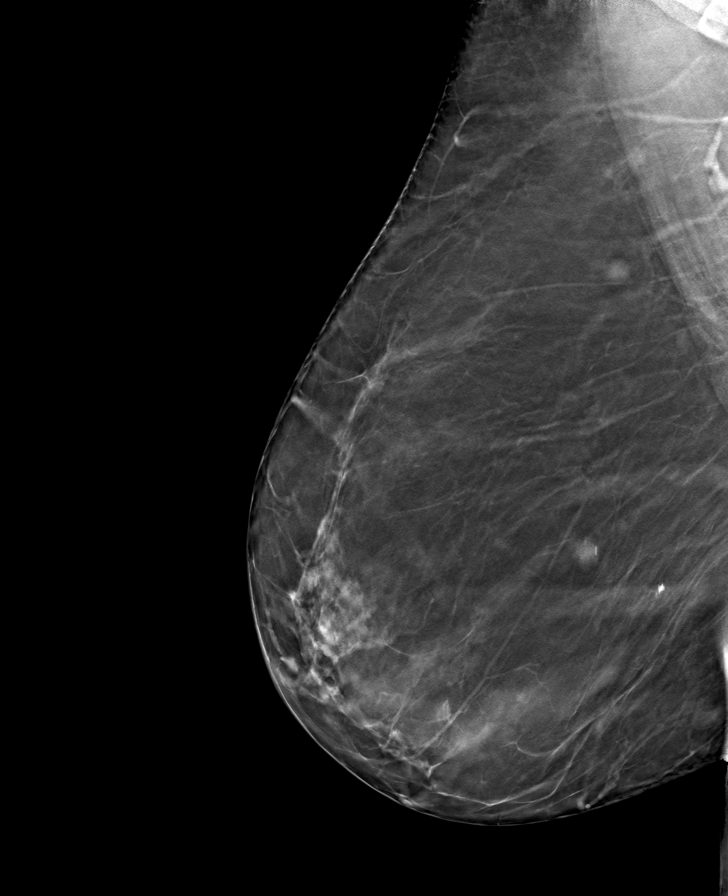

[L CC tomo · tomo slice 40/79.0]
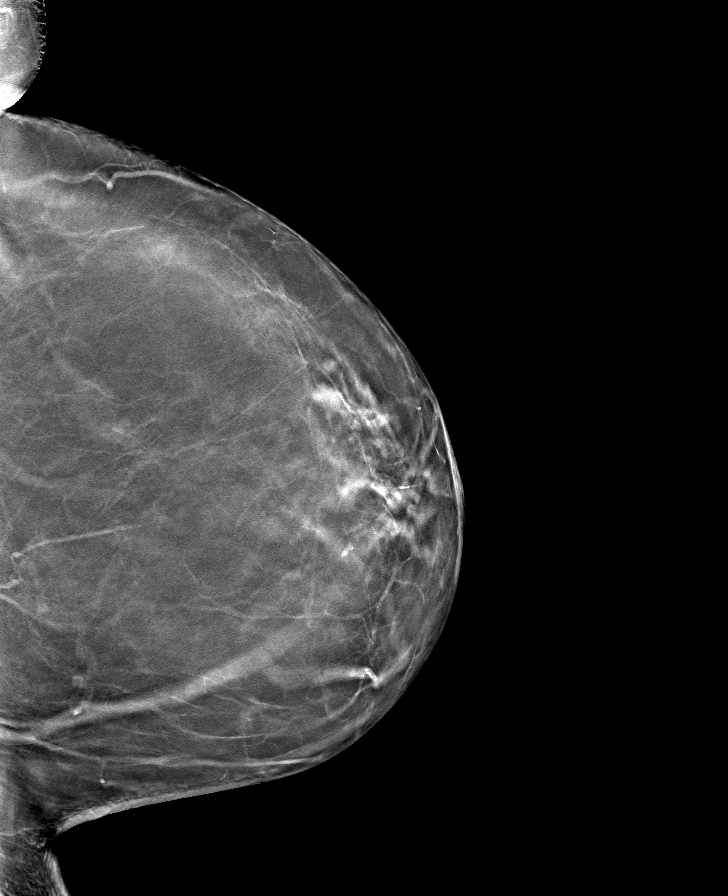

[L MLO tomo · tomo slice 41/82.0]
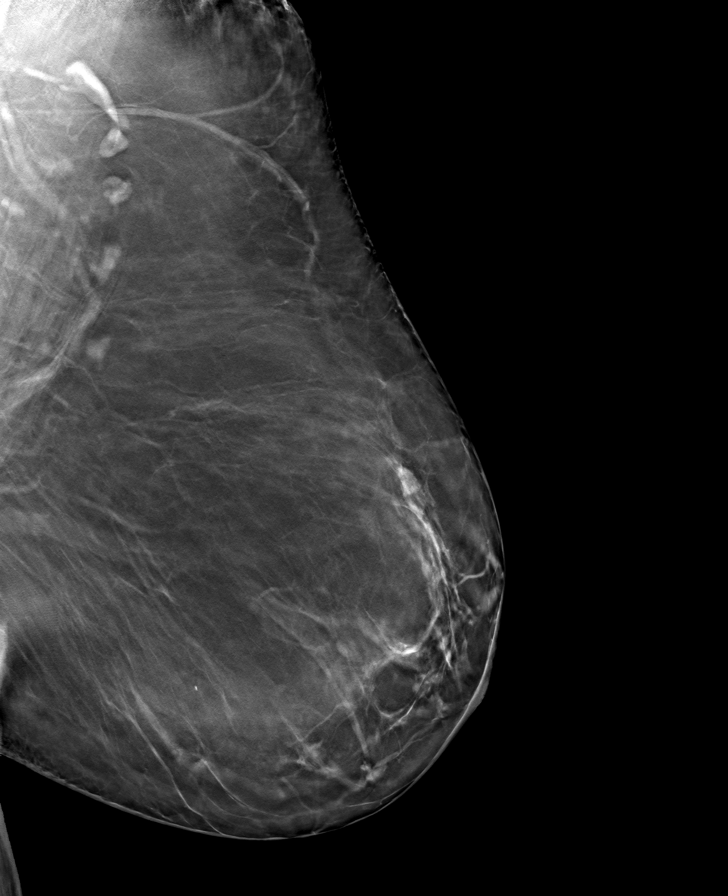

[R CC tomo · tomo slice 35/70.0]
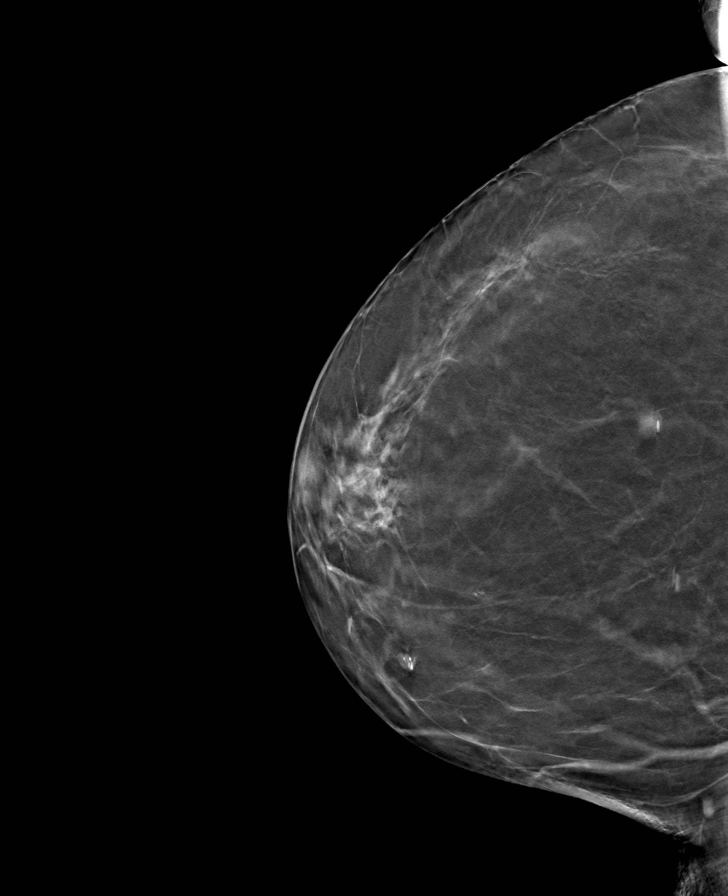

[8 of 24 positions shown; findings below may reference images not displayed]

ACR Breast Density Category b: There are scattered areas of
fibroglandular density.
FINDINGS: There are no findings suspicious for malignancy.
IMPRESSION: No mammographic evidence of malignancy. A result letter of this
screening mammogram will be mailed directly to the patient.

RECOMMENDATION:
Screening mammogram in one year. (Code:51-O-LD2)

BI-RADS CATEGORY  1: Negative.

## 2023-04-15 ENCOUNTER — Other Ambulatory Visit: Payer: Self-pay | Admitting: Obstetrics and Gynecology

## 2023-04-15 DIAGNOSIS — Z1231 Encounter for screening mammogram for malignant neoplasm of breast: Secondary | ICD-10-CM

## 2023-05-19 ENCOUNTER — Ambulatory Visit
Admission: RE | Admit: 2023-05-19 | Discharge: 2023-05-19 | Disposition: A | Discharge status: Home / Self Care | Payer: Medicare PPO | Source: Ambulatory Visit | Attending: Obstetrics and Gynecology | Admitting: Obstetrics and Gynecology

## 2023-05-19 DIAGNOSIS — Z1231 Encounter for screening mammogram for malignant neoplasm of breast: Secondary | ICD-10-CM

## 2024-04-18 ENCOUNTER — Other Ambulatory Visit: Payer: Self-pay | Admitting: Obstetrics and Gynecology

## 2024-04-18 DIAGNOSIS — Z1231 Encounter for screening mammogram for malignant neoplasm of breast: Secondary | ICD-10-CM

## 2024-05-19 ENCOUNTER — Ambulatory Visit
Admission: RE | Admit: 2024-05-19 | Discharge: 2024-05-19 | Disposition: A | Discharge status: Home / Self Care | Source: Ambulatory Visit | Attending: Obstetrics and Gynecology | Admitting: Obstetrics and Gynecology

## 2024-05-19 DIAGNOSIS — Z1231 Encounter for screening mammogram for malignant neoplasm of breast: Secondary | ICD-10-CM
